# Patient Record
Sex: Female | Born: 2005 | Race: Black or African American | Hispanic: No | Marital: Single | Smoking: Never smoker
Health system: Southern US, Community
[De-identification: ages and names within clinical notes are randomized; demographics above are authoritative.]

## PROBLEM LIST (undated history)

## (undated) DIAGNOSIS — R011 Cardiac murmur, unspecified: Secondary | ICD-10-CM

## (undated) DIAGNOSIS — J353 Hypertrophy of tonsils with hypertrophy of adenoids: Secondary | ICD-10-CM

## (undated) DIAGNOSIS — J45909 Unspecified asthma, uncomplicated: Secondary | ICD-10-CM

---
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
## 2023-03-05 DIAGNOSIS — Z113 Encounter for screening for infections with a predominantly sexual mode of transmission: Secondary | ICD-10-CM | POA: Diagnosis not present

## 2023-03-15 ENCOUNTER — Encounter (HOSPITAL_COMMUNITY): Payer: Self-pay

## 2023-03-15 ENCOUNTER — Ambulatory Visit (HOSPITAL_COMMUNITY)
Admission: EM | Admit: 2023-03-15 | Discharge: 2023-03-15 | Disposition: A | Discharge status: Home / Self Care | Payer: Medicaid Other | Attending: Emergency Medicine | Admitting: Emergency Medicine

## 2023-03-15 DIAGNOSIS — J4521 Mild intermittent asthma with (acute) exacerbation: Secondary | ICD-10-CM

## 2023-03-15 MED ORDER — BENZONATATE 100 MG PO CAPS: 100.0000 mg | ORAL_CAPSULE | Freq: Three times a day (TID) | ORAL | 0 refills | Status: DC | PRN

## 2023-03-15 MED ORDER — ALBUTEROL SULFATE HFA 108 (90 BASE) MCG/ACT IN AERS
INHALATION_SPRAY | RESPIRATORY_TRACT | Status: AC
  Filled 2023-03-15: qty 6.7

## 2023-03-15 MED ORDER — ALBUTEROL SULFATE HFA 108 (90 BASE) MCG/ACT IN AERS
2.0000 | INHALATION_SPRAY | Freq: Once | RESPIRATORY_TRACT | Status: AC
  Administered 2023-03-15: 2 via RESPIRATORY_TRACT

## 2023-03-15 NOTE — ED Triage Notes (Addendum)
Pt c/o cough, SOB, and chest tightness since last night. States hx of bronchitis, out her of inhaler. NAD. States will need 2 copies of a work note.

## 2023-03-15 NOTE — Discharge Instructions (Addendum)
Please use the inhaler 3 times daily (every 6 hours) for the next several days. Then continue as needed.  The tessalon cough pills can be taken 3x daily. If this medication makes you drowsy, take only one pill before bed.  There is a women's clinic below you can call to set up an appointment with  You can scan the QR code on the last page to get established with a primary care provider

## 2023-03-15 NOTE — ED Provider Notes (Signed)
[facility]    CSN: [id number] Arrival date & time: 03/15/23  1415     History   Chief Complaint Chief Complaint  Patient presents with   Cough    HPI Jillian Mathis is a 17 y.o. female.  Here with chest tightness, dry cough, shortness of breath that began last night. History of asthma and ran out of inhaler about a year ago. She reports getting flare ups with the seasonal changes. She denies fever or congestion Possible sick contacts at work   Requires a note for work   Does not have PCP  Past Medical History:  Diagnosis Date   Asthma    prn inhaler; exacerbated by exercise   Heart murmur    innocent murmur per cardiologist 07/15/2014   Snoring 07/15/2014   Tonsillar and adenoid hypertrophy 07/2014   snores during sleep, mother denies apnea    Patient Active Problem List   Diagnosis Date Noted   History of gonorrhea 07/31/2021   Overweight, pediatric, BMI 85.0-94.9 percentile for age 30/12/2015   Idiopathic scoliosis 06/22/2015   Mild intermittent asthma 07/15/2014   Heart murmur 06/14/2014    Past Surgical History:  Procedure Laterality Date   TONSILLECTOMY AND ADENOIDECTOMY Bilateral 08/09/2014   Procedure: BILATERAL TONSILLECTOMY AND ADENOIDECTOMY;  Surgeon: Newman Pies, MD;  Location: [facility];  Service: ENT;  Laterality: Bilateral;    OB History     Gravida  1   Para      Term      Preterm      AB  1   Living         SAB      IAB  1   Ectopic      Multiple      Live Births               Home Medications    Prior to Admission medications   Medication Sig Start Date End Date Taking? Authorizing Provider  benzonatate (TESSALON) 100 MG capsule Take 1 capsule (100 mg total) by mouth 3 (three) times daily as needed for cough. 03/15/23  Yes Jaleiah Asay, Lurena Joiner, PA-C  ibuprofen (ADVIL) 800 MG tablet Take 1 tablet (800 mg total) by mouth 3 (three) times daily with meals. 06/23/22   Mardella Layman, MD    Family  History Family History  Problem Relation Age of Onset   Healthy Mother    Heart disease Father        heart murmur   ADD / ADHD Brother    Hypertension Maternal Grandmother    Hypertension Maternal Grandfather     Social History Social History   Tobacco Use   Smoking status: Never   Smokeless tobacco: Never  Vaping Use   Vaping status: Never Used  Substance Use Topics   Alcohol use: No    Alcohol/week: 0.0 standard drinks of alcohol   Drug use: Yes    Types: Marijuana     Allergies   Shellfish allergy   Review of Systems Review of Systems  Respiratory:  Positive for cough.    Per HPI  Physical Exam Triage Vital Signs ED Triage Vitals [03/15/23 1522]  Encounter Vitals Group     BP 126/77     Systolic BP Percentile      Diastolic BP Percentile      Pulse Rate 71     Resp 18     Temp 98.8 F (37.1 C)     Temp src  SpO2 96 %     Weight      Height      Head Circumference      Peak Flow      Pain Score 0     Pain Loc      Pain Education      Exclude from Growth Chart    No data found.  Updated Vital Signs BP 126/77 (BP Location: Right Arm)   Pulse 71   Temp 98.8 F (37.1 C)   Resp 18   LMP 02/25/2023 (Exact Date)   SpO2 96%   e Physical Exam Vitals and nursing note reviewed.  Constitutional:      Appearance: She is not ill-appearing.  HENT:     Nose: No congestion or rhinorrhea.     Mouth/Throat:     Mouth: Mucous membranes are moist.     Pharynx: Oropharynx is clear. No posterior oropharyngeal erythema.  Eyes:     Conjunctiva/sclera: Conjunctivae normal.  Cardiovascular:     Rate and Rhythm: Normal rate and regular rhythm.     Pulses: Normal pulses.     Heart sounds: Normal heart sounds.  Pulmonary:     Effort: Pulmonary effort is normal. No respiratory distress.     Breath sounds: Normal breath sounds. No wheezing.     Comments: No wheezing. No acute distress. Speaks in full sentences. Intermittent dry reactive cough with deep  breath  Musculoskeletal:     Cervical back: Normal range of motion.  Lymphadenopathy:     Cervical: No cervical adenopathy.  Skin:    General: Skin is warm and dry.  Neurological:     Mental Status: She is alert and oriented to person, place, and time.     UC Treatments / Results  Labs (all labs ordered are listed, but only abnormal results are displayed) Labs Reviewed - No data to display  EKG  Radiology No results found.  Procedures Procedures   Medications Ordered in UC Medications  albuterol (VENTOLIN HFA) 108 (90 Base) MCG/ACT inhaler 2 puff (2 puffs Inhalation Given 03/15/23 1545)    Initial Impression / Assessment and Plan / UC Course  I have reviewed the triage vital signs and the nursing notes.  Pertinent labs & imaging results that were available during my care of the patient were reviewed by me and considered in my medical decision making (see chart for details).  Well appearing, clear lungs, afebrile. Sating 97% on room air Reactive airway. 2 puffs inhaler given in clinic. Advised to use q6 hours for the next several days. Tessalon TID prn. Work note provided. Return and ED precautions. Patient agreeable to plan, no questions Advised establish with PCP. Patient also inquiring about ob/gyn, provided info.  Final Clinical Impressions(s) / UC Diagnoses   Final diagnoses:  Mild intermittent reactive airway disease with acute exacerbation     Discharge Instructions      Please use the inhaler 3 times daily (every 6 hours) for the next several days. Then continue as needed.  The tessalon cough pills can be taken 3x daily. If this medication makes you drowsy, take only one pill before bed.  There is a women's clinic below you can call to set up an appointment with  You can scan the QR code on the last page to get established with a primary care provider      ED Prescriptions     Medication Sig Dispense Auth. Provider   benzonatate (TESSALON) 100 MG  capsule Take 1  capsule (100 mg total) by mouth 3 (three) times daily as needed for cough. 30 capsule Darelle Kings, Lurena Joiner, PA-C      PDMP not reviewed this encounter.   Bomani Oommen, Lurena Joiner, PA-C 03/15/23 1600

## 2023-10-10 ENCOUNTER — Ambulatory Visit: Admitting: Family

## 2023-10-10 ENCOUNTER — Encounter: Payer: Self-pay | Admitting: Family

## 2023-10-10 ENCOUNTER — Other Ambulatory Visit (HOSPITAL_COMMUNITY)
Admission: RE | Admit: 2023-10-10 | Discharge: 2023-10-10 | Disposition: A | Discharge status: Home / Self Care | Source: Ambulatory Visit | Attending: Family | Admitting: Family

## 2023-10-10 VITALS — BP 121/77 | HR 101 | Temp 98.8°F | Resp 16 | Ht 63.0 in | Wt 147.2 lb

## 2023-10-10 DIAGNOSIS — Z0000 Encounter for general adult medical examination without abnormal findings: Secondary | ICD-10-CM | POA: Diagnosis not present

## 2023-10-10 DIAGNOSIS — Z1329 Encounter for screening for other suspected endocrine disorder: Secondary | ICD-10-CM

## 2023-10-10 DIAGNOSIS — Z13220 Encounter for screening for lipoid disorders: Secondary | ICD-10-CM

## 2023-10-10 DIAGNOSIS — Z131 Encounter for screening for diabetes mellitus: Secondary | ICD-10-CM

## 2023-10-10 DIAGNOSIS — Z7689 Persons encountering health services in other specified circumstances: Secondary | ICD-10-CM

## 2023-10-10 DIAGNOSIS — Z113 Encounter for screening for infections with a predominantly sexual mode of transmission: Secondary | ICD-10-CM

## 2023-10-10 DIAGNOSIS — Z13228 Encounter for screening for other metabolic disorders: Secondary | ICD-10-CM

## 2023-10-10 DIAGNOSIS — Z9089 Acquired absence of other organs: Secondary | ICD-10-CM

## 2023-10-10 DIAGNOSIS — Z130 Encounter for screening for diseases of the blood and blood-forming organs and certain disorders involving the immune mechanism: Secondary | ICD-10-CM | POA: Diagnosis not present

## 2023-10-10 DIAGNOSIS — Z9289 Personal history of other medical treatment: Secondary | ICD-10-CM

## 2023-10-10 DIAGNOSIS — Z1159 Encounter for screening for other viral diseases: Secondary | ICD-10-CM

## 2023-10-10 NOTE — Progress Notes (Signed)
 Subjective:    Jillian Mathis - 18 y.o. female MRN [id number]  Date of birth: April 28, 2006  HPI  Jillian Mathis is to establish care and annual exam.   Current issues and/or concerns: - Reports history of tonsillectomy in grade 5. States when weather is cold she feels irritation at the tonsil area. Denies red flag symptoms.  - No further issues/concerns for discussion today.  ROS per HPI    Health Maintenance:  Health Maintenance Due  Topic Date Due   Hepatitis C Screening  Never done   COVID-19 Vaccine (1 - 2024-25 season) Never done   CHLAMYDIA SCREENING  03/19/2023     Past Medical History: Patient Active Problem List   Diagnosis Date Noted   History of gonorrhea 07/31/2021   Overweight, pediatric, BMI 85.0-94.9 percentile for age 26/12/2015   Idiopathic scoliosis 06/22/2015   Mild intermittent asthma 07/15/2014   Heart murmur 06/14/2014      Social History   reports that she has never smoked. She has never used smokeless tobacco. She reports current drug use. Drug: Marijuana. She reports that she does not drink alcohol .   Family History  family history includes ADD / ADHD in her brother; Healthy in her mother; Heart disease in her father; Hypertension in her maternal grandfather and maternal grandmother.   Medications: reviewed and updated   Objective:   Physical Exam BP 121/77   Pulse (!) 101   Temp 98.8 F (37.1 C) (Oral)   Resp 16   Ht 5\' 3"  (1.6 m)   Wt 147 lb 3.2 oz (66.8 kg)   LMP 09/29/2023 (Exact Date)   SpO2 96%   BMI 26.08 kg/m   Physical Exam HENT:     Head: Normocephalic and atraumatic.     Right Ear: Tympanic membrane, ear canal and external ear normal.     Left Ear: Tympanic membrane, ear canal and external ear normal.     Nose: Nose normal.     Mouth/Throat:     Mouth: Mucous membranes are moist.     Pharynx: Oropharynx is clear.  Eyes:     Extraocular Movements: Extraocular movements intact.     Conjunctiva/sclera:  Conjunctivae normal.     Pupils: Pupils are equal, round, and reactive to light.  Neck:     Thyroid: No thyroid mass, thyromegaly or thyroid tenderness.  Cardiovascular:     Rate and Rhythm: Tachycardia present.     Pulses: Normal pulses.     Heart sounds: Normal heart sounds.  Pulmonary:     Effort: Pulmonary effort is normal.     Breath sounds: Normal breath sounds.  Chest:     Comments: Patient declined. Abdominal:     General: Bowel sounds are normal.     Palpations: Abdomen is soft.  Genitourinary:    Comments: Patient declined. Musculoskeletal:        General: Normal range of motion.     Right shoulder: Normal.     Left shoulder: Normal.     Right upper arm: Normal.     Left upper arm: Normal.     Right elbow: Normal.     Left elbow: Normal.     Right forearm: Normal.     Left forearm: Normal.     Right wrist: Normal.     Left wrist: Normal.     Right hand: Normal.     Left hand: Normal.     Cervical back: Normal, normal range of motion and neck supple.  Thoracic back: Normal.     Lumbar back: Normal.     Right hip: Normal.     Left hip: Normal.     Right upper leg: Normal.     Left upper leg: Normal.     Right knee: Normal.     Left knee: Normal.     Right lower leg: Normal.     Left lower leg: Normal.     Right ankle: Normal.     Left ankle: Normal.     Right foot: Normal.     Left foot: Normal.  Skin:    General: Skin is warm and dry.     Capillary Refill: Capillary refill takes less than 2 seconds.  Neurological:     General: No focal deficit present.     Mental Status: She is alert and oriented to person, place, and time.  Psychiatric:        Mood and Affect: Mood normal.        Behavior: Behavior normal.       Assessment & Plan:  1. Encounter to establish care (Primary) 2. Annual physical exam - Counseled on 150 minutes of exercise per week as tolerated, healthy eating (including decreased daily intake of saturated fats, cholesterol, added  sugars, sodium), STI prevention, and routine healthcare maintenance.  3. Screening for metabolic disorder - Routine screening.  - CMP14+EGFR  4. Screening for deficiency anemia - Routine screening.  - CBC  5. Diabetes mellitus screening - Routine screening.  - Hemoglobin A1c  6. Screening cholesterol level - Routine screening.  - Lipid panel  7. Thyroid disorder screen - Routine screening.  - TSH  8. Need for hepatitis C screening test - Routine screening.  - Hepatitis C Antibody  9. Routine screening for STI (sexually transmitted infection) - Routine screening.  - Cervicovaginal ancillary only  10. History of tonsillectomy - Referral to ENT for evaluation/management. - Ambulatory referral to ENT     Patient was given clear instructions to go to Emergency Department or return to medical center if symptoms don't improve, worsen, or new problems develop.The patient verbalized understanding.  I discussed the assessment and treatment plan with the patient. The patient was provided an opportunity to ask questions and all were answered. The patient agreed with the plan and demonstrated an understanding of the instructions.   The patient was advised to call back or seek an in-person evaluation if the symptoms worsen or if the condition fails to improve as anticipated.    Lavona Pounds, NP 10/10/2023, 10:40 AM [facility]

## 2023-10-10 NOTE — Progress Notes (Signed)
 No concerns.

## 2023-10-11 ENCOUNTER — Ambulatory Visit: Payer: Self-pay | Admitting: Family

## 2023-10-11 DIAGNOSIS — B9689 Other specified bacterial agents as the cause of diseases classified elsewhere: Secondary | ICD-10-CM

## 2023-10-11 DIAGNOSIS — A749 Chlamydial infection, unspecified: Secondary | ICD-10-CM

## 2023-10-11 LAB — CMP14+EGFR
ALT: 10 IU/L (ref 0–32)
AST: 16 IU/L (ref 0–40)
Albumin: 4.6 g/dL (ref 4.0–5.0)
Alkaline Phosphatase: 54 IU/L (ref 42–106)
BUN/Creatinine Ratio: 15 (ref 9–23)
BUN: 14 mg/dL (ref 6–20)
Bilirubin Total: 0.7 mg/dL (ref 0.0–1.2)
CO2: 21 mmol/L (ref 20–29)
Calcium: 9.4 mg/dL (ref 8.7–10.2)
Chloride: 104 mmol/L (ref 96–106)
Creatinine, Ser: 0.93 mg/dL (ref 0.57–1.00)
Globulin, Total: 2.6 g/dL (ref 1.5–4.5)
Glucose: 82 mg/dL (ref 70–99)
Potassium: 4.2 mmol/L (ref 3.5–5.2)
Sodium: 140 mmol/L (ref 134–144)
Total Protein: 7.2 g/dL (ref 6.0–8.5)
eGFR: 90 mL/min/{1.73_m2} (ref >59)

## 2023-10-11 LAB — CERVICOVAGINAL ANCILLARY ONLY
Bacterial Vaginitis (gardnerella): POSITIVE — AB
Candida Glabrata: NEGATIVE
Candida Vaginitis: NEGATIVE
Chlamydia: POSITIVE — AB
Comment: NEGATIVE
Comment: NEGATIVE
Comment: NEGATIVE
Comment: NEGATIVE
Comment: NEGATIVE
Comment: NORMAL
Neisseria Gonorrhea: NEGATIVE
Trichomonas: NEGATIVE

## 2023-10-11 LAB — HEPATITIS C ANTIBODY: Hep C Virus Ab: NONREACTIVE

## 2023-10-11 LAB — LIPID PANEL
Chol/HDL Ratio: 2.3 ratio (ref 0.0–4.4)
Cholesterol, Total: 143 mg/dL (ref 100–199)
HDL: 62 mg/dL (ref >39)
LDL Chol Calc (NIH): 72 mg/dL (ref 0–99)
Triglycerides: 37 mg/dL (ref 0–149)
VLDL Cholesterol Cal: 9 mg/dL (ref 5–40)

## 2023-10-11 LAB — CBC
Hematocrit: 41 % (ref 34.0–46.6)
Hemoglobin: 13.3 g/dL (ref 11.1–15.9)
MCH: 32.5 pg (ref 26.6–33.0)
MCHC: 32.4 g/dL (ref 31.5–35.7)
MCV: 100 fL — ABNORMAL HIGH (ref 79–97)
Platelets: 282 10*3/uL (ref 150–450)
RBC: 4.09 x10E6/uL (ref 3.77–5.28)
RDW: 11.1 % — ABNORMAL LOW (ref 11.7–15.4)
WBC: 7.6 10*3/uL (ref 3.4–10.8)

## 2023-10-11 LAB — HEMOGLOBIN A1C
Est. average glucose Bld gHb Est-mCnc: 100 mg/dL
Hgb A1c MFr Bld: 5.1 % (ref 4.8–5.6)

## 2023-10-11 LAB — TSH: TSH: 0.76 u[IU]/mL (ref 0.450–4.500)

## 2023-10-11 MED ORDER — DOXYCYCLINE HYCLATE 100 MG PO TABS: 100.0000 mg | ORAL_TABLET | Freq: Two times a day (BID) | ORAL | 0 refills | Status: AC

## 2023-10-11 MED ORDER — METRONIDAZOLE 500 MG PO TABS
500.0000 mg | ORAL_TABLET | Freq: Two times a day (BID) | ORAL | 0 refills | Status: AC
Start: 2023-10-11 — End: 2023-10-18

## 2023-10-16 NOTE — Telephone Encounter (Signed)
 Noted

## 2023-10-21 ENCOUNTER — Encounter: Payer: Self-pay | Admitting: Family

## 2023-10-21 ENCOUNTER — Encounter: Admitting: Family

## 2023-10-21 NOTE — Progress Notes (Signed)
 Erroneous encounter-disregard

## 2023-10-23 ENCOUNTER — Ambulatory Visit (HOSPITAL_COMMUNITY)
Admission: EM | Admit: 2023-10-23 | Discharge: 2023-10-23 | Disposition: A | Discharge status: Home / Self Care | Attending: Emergency Medicine | Admitting: Emergency Medicine

## 2023-10-23 ENCOUNTER — Encounter (HOSPITAL_COMMUNITY): Payer: Self-pay

## 2023-10-23 DIAGNOSIS — R21 Rash and other nonspecific skin eruption: Secondary | ICD-10-CM | POA: Diagnosis not present

## 2023-10-23 DIAGNOSIS — T7840XA Allergy, unspecified, initial encounter: Secondary | ICD-10-CM

## 2023-10-23 DIAGNOSIS — L509 Urticaria, unspecified: Secondary | ICD-10-CM | POA: Diagnosis not present

## 2023-10-23 MED ORDER — DEXAMETHASONE SODIUM PHOSPHATE 10 MG/ML IJ SOLN
10.0000 mg | Freq: Once | INTRAMUSCULAR | Status: AC
  Administered 2023-10-23: 10 mg via INTRAMUSCULAR

## 2023-10-23 MED ORDER — DEXAMETHASONE SODIUM PHOSPHATE 10 MG/ML IJ SOLN
INTRAMUSCULAR | Status: AC
  Filled 2023-10-23: qty 1

## 2023-10-23 MED ORDER — CETIRIZINE HCL 10 MG PO TABS: 10.0000 mg | ORAL_TABLET | Freq: Every day | ORAL | 0 refills | Status: DC

## 2023-10-23 MED ORDER — PREDNISONE 20 MG PO TABS: 40.0000 mg | ORAL_TABLET | Freq: Every day | ORAL | 0 refills | Status: AC

## 2023-10-23 NOTE — ED Provider Notes (Signed)
 [facility]    CSN: [id number] Arrival date & time: 10/23/23  0857      History   Chief Complaint Chief Complaint  Patient presents with   Rash    HPI Jillian Mathis is a 18 y.o. female.   Patient presents with rash to her arms and torso that began 2 days ago.  Patient denies any known exposures to allergens, but does state that she just completed a course of doxycycline  and Flagyl  as prescribed by her primary care provider.  Patient denies any new detergents, body washes, lotions, perfumes, or products that she can think of.  Patient also denies any new foods.  Denies lip, tongue, and throat swelling, trouble breathing, and shortness of breath.  The history is provided by the patient and medical records.  Rash   Past Medical History:  Diagnosis Date   Asthma    prn inhaler; exacerbated by exercise   Heart murmur    innocent murmur per cardiologist 07/15/2014   Snoring 07/15/2014   Tonsillar and adenoid hypertrophy 07/2014   snores during sleep, mother denies apnea    Patient Active Problem List   Diagnosis Date Noted   History of gonorrhea 07/31/2021   Overweight, pediatric, BMI 85.0-94.9 percentile for age 80/12/2015   Idiopathic scoliosis 06/22/2015   Mild intermittent asthma 07/15/2014   Heart murmur 06/14/2014    Past Surgical History:  Procedure Laterality Date   TONSILLECTOMY AND ADENOIDECTOMY Bilateral 08/09/2014   Procedure: BILATERAL TONSILLECTOMY AND ADENOIDECTOMY;  Surgeon: Reynold Caves, MD;  Location: [facility];  Service: ENT;  Laterality: Bilateral;    OB History     Gravida  1   Para      Term      Preterm      AB  1   Living         SAB      IAB  1   Ectopic      Multiple      Live Births               Home Medications    Prior to Admission medications   Medication Sig Start Date End Date Taking? Authorizing Provider  cetirizine  (ZYRTEC  ALLERGY) 10 MG tablet Take 1 tablet (10 mg total) by mouth  daily. 10/23/23  Yes Rosevelt Constable, Nakaila Freeze A, NP  predniSONE (DELTASONE) 20 MG tablet Take 2 tablets (40 mg total) by mouth daily for 5 days. 10/23/23 10/28/23 Yes Rosevelt Constable, Keiandre Cygan A, NP  benzonatate  (TESSALON ) 100 MG capsule Take 1 capsule (100 mg total) by mouth 3 (three) times daily as needed for cough. Patient not taking: Reported on 10/10/2023 03/15/23   Rising, Ivette Marks, PA-C  ibuprofen  (ADVIL ) 800 MG tablet Take 1 tablet (800 mg total) by mouth 3 (three) times daily with meals. Patient not taking: Reported on 10/10/2023 06/23/22   Afton Albright, MD    Family History Family History  Problem Relation Age of Onset   Healthy Mother    Heart disease Father        heart murmur   ADD / ADHD Brother    Hypertension Maternal Grandmother    Hypertension Maternal Grandfather     Social History Social History   Tobacco Use   Smoking status: Never   Smokeless tobacco: Never  Vaping Use   Vaping status: Never Used  Substance Use Topics   Alcohol  use: No    Alcohol /week: 0.0 standard drinks of alcohol    Drug use: Yes  Types: Marijuana     Allergies   Shellfish allergy   Review of Systems Review of Systems  Skin:  Positive for rash.   Per HPI  Physical Exam Triage Vital Signs ED Triage Vitals  Encounter Vitals Group     BP 10/23/23 1057 113/69     Systolic BP Percentile --      Diastolic BP Percentile --      Pulse Rate 10/23/23 1057 61     Resp 10/23/23 1056 17     Temp 10/23/23 1056 98.8 F (37.1 C)     Temp Source 10/23/23 1056 Oral     SpO2 10/23/23 1056 98 %     Weight --      Height --      Head Circumference --      Peak Flow --      Pain Score 10/23/23 1055 0     Pain Loc --      Pain Education --      Exclude from Growth Chart --    No data found.  Updated Vital Signs BP 113/69   Pulse 61   Temp 98.8 F (37.1 C) (Oral)   Resp 17   LMP 09/29/2023 (Exact Date)   SpO2 98%   Visual Acuity Right Eye Distance:   Left Eye Distance:   Bilateral Distance:     Right Eye Near:   Left Eye Near:    Bilateral Near:     Physical Exam Vitals and nursing note reviewed.  Constitutional:      General: She is awake. She is not in acute distress.    Appearance: Normal appearance. She is well-developed and well-groomed. She is not ill-appearing.  Skin:    General: Skin is warm and dry.     Findings: Rash present. Rash is urticarial.     Comments: Urticarial rash noted to anterior torso and bilateral arms.  Neurological:     Mental Status: She is alert.  Psychiatric:        Behavior: Behavior is cooperative.      UC Treatments / Results  Labs (all labs ordered are listed, but only abnormal results are displayed) Labs Reviewed - No data to display  EKG   Radiology No results found.  Procedures Procedures (including critical care time)  Medications Ordered in UC Medications  dexamethasone  (DECADRON ) injection 10 mg (has no administration in time range)    Initial Impression / Assessment and Plan / UC Course  I have reviewed the triage vital signs and the nursing notes.  Pertinent labs & imaging results that were available during my care of the patient were reviewed by me and considered in my medical decision making (see chart for details).     Patient is well-appearing.  Vitals are stable.  Urticarial rash noted to anterior torso and bilateral arms.  Rash could be related to doxycycline  and Flagyl , but difficult to determine.    Given IM Decadron  in clinic for rash.  Prescribed prednisone burst for additional relief of rash.  Prescribed cetirizine  to take once daily to help with itching.  Discussed use of over-the-counter Benadryl if needed.  Discussed follow-up, return, and strict ER precautions. Final Clinical Impressions(s) / UC Diagnoses   Final diagnoses:  Rash and nonspecific skin eruption  Hives  Allergic reaction, initial encounter     Discharge Instructions      You are given an injection of steroids here today  to help with your rash. Tomorrow start taking 2  tablets of prednisone once daily for 5 days to continue to help with the rash. Take 1 tablet of cetirizine  once daily to help with itching and rash. You can take 25 mg of Benadryl every 6 hours as needed for itching.  This can make you drowsy so do not drive, work, or drink alcohol  while taking this. The rash could be result of the doxycycline  and Flagyl  that you just recently took, I would discuss this with your primary care provider. Follow-up with your primary care provider or return here as needed. If you develop worsening rash, lip, tongue, or throat swelling, shortness of breath, or trouble breathing please seek immediate medical treatment in the emergency department.  ED Prescriptions     Medication Sig Dispense Auth. Provider   predniSONE (DELTASONE) 20 MG tablet Take 2 tablets (40 mg total) by mouth daily for 5 days. 10 tablet Levora Reas A, NP   cetirizine  (ZYRTEC  ALLERGY) 10 MG tablet Take 1 tablet (10 mg total) by mouth daily. 30 tablet Levora Reas A, NP      PDMP not reviewed this encounter.   Levora Reas A, NP 10/23/23 1139

## 2023-10-23 NOTE — Discharge Instructions (Signed)
 You are given an injection of steroids here today to help with your rash. Tomorrow start taking 2 tablets of prednisone once daily for 5 days to continue to help with the rash. Take 1 tablet of cetirizine  once daily to help with itching and rash. You can take 25 mg of Benadryl every 6 hours as needed for itching.  This can make you drowsy so do not drive, work, or drink alcohol  while taking this. The rash could be result of the doxycycline  and Flagyl  that you just recently took, I would discuss this with your primary care provider. Follow-up with your primary care provider or return here as needed. If you develop worsening rash, lip, tongue, or throat swelling, shortness of breath, or trouble breathing please seek immediate medical treatment in the emergency department.

## 2023-10-23 NOTE — ED Triage Notes (Addendum)
 Pt present with a rash x Monday morning. Pt states it has spread all over her body during these past two days.   Pt c/o itching and pain. Pt states she was recently taking doxycycline  and flagyl .

## 2023-11-04 ENCOUNTER — Encounter: Payer: Self-pay | Admitting: Family

## 2023-11-04 NOTE — Telephone Encounter (Signed)
 I have attempted to contact this patient by phone with the following results: no answer, left message to return my call on answering machine.

## 2023-11-05 NOTE — Telephone Encounter (Unsigned)
 Copied from CRM [id number]. Topic: General - Other >> Nov 05, 2023  9:37 AM Myrick T wrote: Reason for CRM: patient returning practice call. Per CAL the person that called patient was busy but would call her back.

## 2023-11-06 ENCOUNTER — Telehealth: Payer: Self-pay | Admitting: *Deleted

## 2023-11-06 ENCOUNTER — Other Ambulatory Visit: Payer: Self-pay | Admitting: *Deleted

## 2023-11-06 ENCOUNTER — Other Ambulatory Visit: Payer: Self-pay | Admitting: Family

## 2023-11-06 DIAGNOSIS — J4520 Mild intermittent asthma, uncomplicated: Secondary | ICD-10-CM

## 2023-11-06 DIAGNOSIS — J45909 Unspecified asthma, uncomplicated: Secondary | ICD-10-CM

## 2023-11-06 MED ORDER — ALBUTEROL SULFATE HFA 108 (90 BASE) MCG/ACT IN AERS: 2.0000 | INHALATION_SPRAY | Freq: Four times a day (QID) | RESPIRATORY_TRACT | 2 refills | Status: DC | PRN

## 2023-11-06 NOTE — Telephone Encounter (Signed)
albuterol (PROVENTIL HFA;VENTOLIN HFA) 108 (90 BASE) MCG/ACT inhaler

## 2023-11-06 NOTE — Telephone Encounter (Signed)
 Complete

## 2024-01-09 ENCOUNTER — Emergency Department (HOSPITAL_COMMUNITY)
Admission: EM | Admit: 2024-01-09 | Discharge: 2024-01-10 | Disposition: A | Discharge status: Home / Self Care | Attending: Emergency Medicine | Admitting: Emergency Medicine

## 2024-01-09 ENCOUNTER — Other Ambulatory Visit: Payer: Self-pay

## 2024-01-09 ENCOUNTER — Encounter (HOSPITAL_COMMUNITY): Payer: Self-pay

## 2024-01-09 ENCOUNTER — Ambulatory Visit (HOSPITAL_COMMUNITY)
Admission: EM | Admit: 2024-01-09 | Discharge: 2024-01-09 | Disposition: A | Discharge status: Home / Self Care | Attending: Nurse Practitioner | Admitting: Nurse Practitioner

## 2024-01-09 ENCOUNTER — Emergency Department (HOSPITAL_COMMUNITY)

## 2024-01-09 DIAGNOSIS — B9689 Other specified bacterial agents as the cause of diseases classified elsewhere: Secondary | ICD-10-CM | POA: Diagnosis not present

## 2024-01-09 DIAGNOSIS — R739 Hyperglycemia, unspecified: Secondary | ICD-10-CM

## 2024-01-09 DIAGNOSIS — N939 Abnormal uterine and vaginal bleeding, unspecified: Secondary | ICD-10-CM | POA: Insufficient documentation

## 2024-01-09 DIAGNOSIS — N760 Acute vaginitis: Secondary | ICD-10-CM | POA: Diagnosis not present

## 2024-01-09 DIAGNOSIS — R109 Unspecified abdominal pain: Secondary | ICD-10-CM | POA: Diagnosis present

## 2024-01-09 DIAGNOSIS — Z3202 Encounter for pregnancy test, result negative: Secondary | ICD-10-CM | POA: Diagnosis not present

## 2024-01-09 DIAGNOSIS — R1030 Lower abdominal pain, unspecified: Secondary | ICD-10-CM | POA: Insufficient documentation

## 2024-01-09 DIAGNOSIS — N72 Inflammatory disease of cervix uteri: Secondary | ICD-10-CM | POA: Insufficient documentation

## 2024-01-09 DIAGNOSIS — N898 Other specified noninflammatory disorders of vagina: Secondary | ICD-10-CM | POA: Insufficient documentation

## 2024-01-09 DIAGNOSIS — R1031 Right lower quadrant pain: Secondary | ICD-10-CM | POA: Diagnosis not present

## 2024-01-09 DIAGNOSIS — K37 Unspecified appendicitis: Secondary | ICD-10-CM | POA: Diagnosis not present

## 2024-01-09 LAB — COMPREHENSIVE METABOLIC PANEL WITH GFR
ALT: 18 U/L (ref 0–44)
ALT: 11 U/L (ref 0–44)
AST: 23 U/L (ref 15–41)
AST: 17 U/L (ref 15–41)
Albumin: 4 g/dL (ref 3.5–5.0)
Albumin: 4 g/dL (ref 3.5–5.0)
Alkaline Phosphatase: 51 U/L (ref 38–126)
Alkaline Phosphatase: 101 U/L (ref 38–126)
Anion gap: 12 (ref 5–15)
Anion gap: 10 (ref 5–15)
BUN: 11 mg/dL (ref 6–20)
BUN: 12 mg/dL (ref 6–20)
CO2: 23 mmol/L (ref 22–32)
CO2: 22 mmol/L (ref 22–32)
Calcium: 9.4 mg/dL (ref 8.9–10.3)
Calcium: 8.7 mg/dL — ABNORMAL LOW (ref 8.9–10.3)
Chloride: 104 mmol/L (ref 98–111)
Chloride: 105 mmol/L (ref 98–111)
Creatinine, Ser: 0.79 mg/dL (ref 0.44–1.00)
Creatinine, Ser: 0.89 mg/dL (ref 0.44–1.00)
GFR, Estimated: >60 mL/min (ref >60)
GFR, Estimated: >60 mL/min (ref >60)
Glucose, Bld: 61 mg/dL — ABNORMAL LOW (ref 70–99)
Glucose, Bld: 108 mg/dL — ABNORMAL HIGH (ref 70–99)
Potassium: 3.9 mmol/L (ref 3.5–5.1)
Potassium: 3.7 mmol/L (ref 3.5–5.1)
Sodium: 139 mmol/L (ref 135–145)
Sodium: 137 mmol/L (ref 135–145)
Total Bilirubin: 1.1 mg/dL (ref 0.0–1.2)
Total Bilirubin: 1.1 mg/dL (ref 0.0–1.2)
Total Protein: 7.1 g/dL (ref 6.5–8.1)
Total Protein: 6.5 g/dL (ref 6.5–8.1)

## 2024-01-09 LAB — CBC WITH DIFFERENTIAL/PLATELET
Abs Immature Granulocytes: 0.02 K/uL (ref 0.00–0.07)
Abs Immature Granulocytes: 0.02 K/uL (ref 0.00–0.07)
Basophils Absolute: 0 K/uL (ref 0.0–0.1)
Basophils Absolute: 0 K/uL (ref 0.0–0.1)
Basophils Relative: 0 %
Basophils Relative: 0 %
Eosinophils Absolute: 0.1 K/uL (ref 0.0–0.5)
Eosinophils Absolute: 0.1 K/uL (ref 0.0–0.5)
Eosinophils Relative: 1 %
Eosinophils Relative: 1 %
HCT: 40.7 % (ref 36.0–46.0)
HCT: 37.6 % (ref 36.0–46.0)
Hemoglobin: 13.7 g/dL (ref 12.0–15.0)
Hemoglobin: 12.2 g/dL (ref 12.0–15.0)
Immature Granulocytes: 0 %
Immature Granulocytes: 0 %
Lymphocytes Relative: 20 %
Lymphocytes Relative: 23 %
Lymphs Abs: 1.3 K/uL (ref 0.7–4.0)
Lymphs Abs: 2 K/uL (ref 0.7–4.0)
MCH: 32.7 pg (ref 26.0–34.0)
MCH: 31.6 pg (ref 26.0–34.0)
MCHC: 33.7 g/dL (ref 30.0–36.0)
MCHC: 32.4 g/dL (ref 30.0–36.0)
MCV: 97.1 fL (ref 80.0–100.0)
MCV: 97.4 fL (ref 80.0–100.0)
Monocytes Absolute: 0.5 K/uL (ref 0.1–1.0)
Monocytes Absolute: 0.6 K/uL (ref 0.1–1.0)
Monocytes Relative: 8 %
Monocytes Relative: 7 %
Neutro Abs: 4.7 K/uL (ref 1.7–7.7)
Neutro Abs: 5.9 K/uL (ref 1.7–7.7)
Neutrophils Relative %: 71 %
Neutrophils Relative %: 69 %
Platelets: 230 K/uL (ref 150–400)
Platelets: 217 K/uL (ref 150–400)
RBC: 4.19 MIL/uL (ref 3.87–5.11)
RBC: 3.86 MIL/uL — ABNORMAL LOW (ref 3.87–5.11)
RDW: 11.9 % (ref 11.5–15.5)
RDW: 11.9 % (ref 11.5–15.5)
WBC: 6.6 K/uL (ref 4.0–10.5)
WBC: 8.6 K/uL (ref 4.0–10.5)
nRBC: 0 % (ref 0.0–0.2)
nRBC: 0 % (ref 0.0–0.2)

## 2024-01-09 LAB — POCT URINALYSIS DIP (MANUAL ENTRY)
Bilirubin, UA: NEGATIVE
Glucose, UA: NEGATIVE mg/dL
Ketones, POC UA: NEGATIVE mg/dL
Nitrite, UA: NEGATIVE
Protein Ur, POC: NEGATIVE mg/dL
Spec Grav, UA: 1.015 (ref 1.010–1.025)
Urobilinogen, UA: 1 U/dL
pH, UA: 7 (ref 5.0–8.0)

## 2024-01-09 LAB — HCG, SERUM, QUALITATIVE: Preg, Serum: NEGATIVE

## 2024-01-09 LAB — POCT URINE PREGNANCY: Preg Test, Ur: NEGATIVE

## 2024-01-09 LAB — LIPASE, BLOOD
Lipase: 28 U/L (ref 11–51)
Lipase: 22 U/L (ref 11–51)

## 2024-01-09 MED ORDER — IBUPROFEN 800 MG PO TABS: 800.0000 mg | ORAL_TABLET | Freq: Three times a day (TID) | ORAL | 0 refills | Status: AC | PRN

## 2024-01-09 MED ORDER — DOXYCYCLINE HYCLATE 100 MG PO CAPS: 100.0000 mg | ORAL_CAPSULE | Freq: Two times a day (BID) | ORAL | 0 refills | Status: DC

## 2024-01-09 MED ORDER — ONDANSETRON HCL 4 MG/2ML IJ SOLN
4.0000 mg | Freq: Once | INTRAMUSCULAR | Status: AC
  Administered 2024-01-09: 4 mg via INTRAVENOUS
  Filled 2024-01-09: qty 2

## 2024-01-09 MED ORDER — MORPHINE SULFATE (PF) 4 MG/ML IV SOLN
4.0000 mg | Freq: Once | INTRAVENOUS | Status: AC
  Administered 2024-01-09: 4 mg via INTRAVENOUS
  Filled 2024-01-09: qty 1

## 2024-01-09 MED ORDER — SODIUM CHLORIDE 0.9 % IV SOLN
1.0000 g | Freq: Once | INTRAVENOUS | Status: AC
  Administered 2024-01-10: 1 g via INTRAVENOUS
  Filled 2024-01-09: qty 10

## 2024-01-09 MED ORDER — ONDANSETRON HCL 4 MG/2ML IJ SOLN
4.0000 mg | Freq: Once | INTRAMUSCULAR | Status: AC
  Administered 2024-01-10: 4 mg via INTRAVENOUS
  Filled 2024-01-09: qty 2

## 2024-01-09 MED ORDER — SODIUM CHLORIDE 0.9 % IV BOLUS
1000.0000 mL | Freq: Once | INTRAVENOUS | Status: AC
  Administered 2024-01-09: 1000 mL via INTRAVENOUS

## 2024-01-09 MED ORDER — AZITHROMYCIN 250 MG PO TABS
1000.0000 mg | ORAL_TABLET | Freq: Once | ORAL | Status: AC
  Administered 2024-01-10: 1000 mg via ORAL
  Filled 2024-01-09: qty 4

## 2024-01-09 MED ORDER — ONDANSETRON 4 MG PO TBDP
ORAL_TABLET | ORAL | 0 refills | Status: AC
Start: 2024-01-09 — End: ?

## 2024-01-09 MED ORDER — METRONIDAZOLE 500 MG PO TABS
2000.0000 mg | ORAL_TABLET | Freq: Once | ORAL | Status: DC
  Filled 2024-01-09: qty 4

## 2024-01-09 NOTE — Discharge Instructions (Addendum)
 Follow up with your PCP or GYN in the office.  Return for worsening pain, fever, inability to eat or drink.   Take 4 over the counter ibuprofen  tablets 3 times a day or 2 over-the-counter naproxen  tablets twice a day for pain. Also take tylenol  1000mg (2 extra strength) four times a day.

## 2024-01-09 NOTE — ED Triage Notes (Signed)
 Chief Complaint: abdominal pain. States she started her cycle was staring. States on seeing blood when she wipes but not on her pad. Saw some brown discharge this morning. Denies NVD.   Sick exposure: No  Onset: 2 days ago.   Prescriptions or OTC medications tried: No    New foods, medications, or products: No  Recent Travel: No

## 2024-01-09 NOTE — ED Provider Notes (Signed)
 [place] EMERGENCY DEPARTMENT AT [facility] Provider Note   CSN: [id number] Arrival date & time: 01/09/24  8187     Patient presents with: No chief complaint on file.   Jillian Mathis is a 18 y.o. female.  {Add pertinent medical, surgical, social history, OB history to HPI:32947} 18 yo F with a chief complaints of right sided abdominal discomfort.  Going on for about 3 days.  Having some nausea but no vomiting.  Also having some vaginal discharge and some vaginal bleeding.  No fevers or chills.  No prior abdominal surgery.  Went to urgent care and they encouraged her to come here for evaluation.        Prior to Admission medications   Medication Sig Start Date End Date Taking? Authorizing Provider  doxycycline  (VIBRAMYCIN ) 100 MG capsule Take 1 capsule (100 mg total) by mouth 2 (two) times daily for 14 days. One po bid x 7 days 01/09/24 01/23/24 Yes Emil Share, DO  ibuprofen  (ADVIL ) 800 MG tablet Take 1 tablet (800 mg total) by mouth every 8 (eight) hours as needed (pain). Take with food to avoid stomach upset. Do not take any additional NSAIDs while on this. You may take tylenol  in addition to this if needed for extra pain relief. 01/09/24   Iola Lukes, FNP    Allergies: Shellfish allergy    Review of Systems  Updated Vital Signs BP 126/76   Pulse 63   Temp 98.6 F (37 C) (Oral)   Resp 16   LMP 01/06/2024 (Approximate)   SpO2 99%   Physical Exam Vitals and nursing note reviewed.  Constitutional:      General: She is not in acute distress.    Appearance: She is well-developed. She is not diaphoretic.  HENT:     Head: Normocephalic and atraumatic.  Eyes:     Pupils: Pupils are equal, round, and reactive to light.  Cardiovascular:     Rate and Rhythm: Normal rate and regular rhythm.     Heart sounds: No murmur heard.    No friction rub. No gallop.  Pulmonary:     Effort: Pulmonary effort is normal.     Breath sounds: No wheezing or rales.   Abdominal:     General: There is no distension.     Palpations: Abdomen is soft.     Tenderness: There is abdominal tenderness.     Comments: Diffusely tender abdomen  Genitourinary:    Comments: Purulent discharge from the cervix.  Cervical motion tenderness.  No obvious unilateral adnexal tenderness or masses. Musculoskeletal:        General: No tenderness.     Cervical back: Normal range of motion and neck supple.  Skin:    General: Skin is warm and dry.  Neurological:     Mental Status: She is alert and oriented to person, place, and time.  Psychiatric:        Behavior: Behavior normal.     (all labs ordered are listed, but only abnormal results are displayed) Labs Reviewed  CBC WITH DIFFERENTIAL/PLATELET - Abnormal; Notable for the following components:      Result Value   RBC 3.86 (*)    All other components within normal limits  COMPREHENSIVE METABOLIC PANEL WITH GFR - Abnormal; Notable for the following components:   Glucose, Bld 108 (*)    Calcium 8.7 (*)    All other components within normal limits  LIPASE, BLOOD  HCG, SERUM, QUALITATIVE  URINALYSIS, ROUTINE W REFLEX MICROSCOPIC  RPR  HIV ANTIBODY (ROUTINE TESTING W REFLEX)  GC/CHLAMYDIA PROBE AMP ([place]) NOT AT [facility]    EKG: None  Radiology: CT Renal Stone Study Result Date: 01/09/2024 CLINICAL DATA:  Abdominal/flank pain, stone suspected. Rule out appendicitis. C/O RLQ abdominal pain X 3 days. Pt also reports possible allergic reaction to ibuprofen , reports taking it after leaving urgent care, and R eye became swollen. EXAM: CT ABDOMEN AND PELVIS WITHOUT CONTRAST TECHNIQUE: Multidetector CT imaging of the abdomen and pelvis was performed following the standard protocol without IV contrast. RADIATION DOSE REDUCTION: This exam was performed according to the departmental dose-optimization program which includes automated exposure control, adjustment of the mA and/or kV according to patient size and/or use of  iterative reconstruction technique. COMPARISON:  CT abdomen pelvis 03/18/2022 FINDINGS: Lower chest: No acute abnormality. Hepatobiliary: No focal liver abnormality. No gallstones, gallbladder wall thickening, or pericholecystic fluid. No biliary dilatation. Pancreas: No focal lesion. Normal pancreatic contour. No surrounding inflammatory changes. No main pancreatic ductal dilatation. Spleen: Normal in size without focal abnormality. Adrenals/Urinary Tract: No adrenal nodule bilaterally. No nephrolithiasis and no hydronephrosis. No definite contour-deforming renal mass. No ureterolithiasis or hydroureter. The urinary bladder is unremarkable. Stomach/Bowel: Stomach is within normal limits. No evidence of bowel wall thickening or dilatation. Appendix appears normal. Vascular/Lymphatic: No abdominal aorta or iliac aneurysm. No abdominal, pelvic, or inguinal lymphadenopathy. Reproductive: Uterus and bilateral adnexa are unremarkable. Other: No intraperitoneal free fluid. No intraperitoneal free gas. No organized fluid collection. Musculoskeletal: No abdominal wall hernia or abnormality. No suspicious lytic or blastic osseous lesions. No acute displaced fracture. IMPRESSION: No acute intra-abdominal or intrapelvic abnormality with limited evaluation on this noncontrast study. Electronically Signed   By: Morgane  Naveau M.D.   On: 01/09/2024 23:17    {Document cardiac monitor, telemetry assessment procedure when appropriate:32947} Procedures   Medications Ordered in the ED  cefTRIAXone  (ROCEPHIN ) 1 g in sodium chloride  0.9 % 100 mL IVPB (has no administration in time range)  azithromycin  (ZITHROMAX ) tablet 1,000 mg (has no administration in time range)  metroNIDAZOLE  (FLAGYL ) tablet 2,000 mg (has no administration in time range)  ondansetron  (ZOFRAN ) injection 4 mg (has no administration in time range)  sodium chloride  0.9 % bolus 1,000 mL (0 mLs Intravenous Stopped 01/09/24 2331)  morphine  (PF) 4 MG/ML  injection 4 mg (4 mg Intravenous Given 01/09/24 2117)  ondansetron  (ZOFRAN ) injection 4 mg (4 mg Intravenous Given 01/09/24 2117)      {Click here for ABCD2, HEART and other calculators REFRESH Note before signing:1}                              Medical Decision Making Amount and/or Complexity of Data Reviewed Labs: ordered. Radiology: ordered.  Risk Prescription drug management.   18 yo F with a chief complaints of abdominal pain.  Going on for about 3 days now.  Tells me it is worse in the right lower quadrant.  Diffuse pain on exam.  Will obtain CT imaging.  Patient also complaining of vaginal discharge.  Will obtain UA.  Treat pain and nausea.  Reassess.   CT abdomen pelvis without obvious acute intra-abdominal pathology.  Pelvic exam concerning for PID.  Will start on antibiotics.  PCP follow-up.  11:37 PM:  I have discussed the diagnosis/risks/treatment options with the patient.  Evaluation and diagnostic testing in the emergency department does not suggest an emergent condition requiring admission or immediate intervention beyond what has been performed  at this time.  They will follow up with PCP. We also discussed returning to the ED immediately if new or worsening sx occur. We discussed the sx which are most concerning (e.g., sudden worsening pain, fever, inability to tolerate by mouth) that necessitate immediate return. Medications administered to the patient during their visit and any new prescriptions provided to the patient are listed below.  Medications given during this visit Medications  cefTRIAXone  (ROCEPHIN ) 1 g in sodium chloride  0.9 % 100 mL IVPB (has no administration in time range)  azithromycin  (ZITHROMAX ) tablet 1,000 mg (has no administration in time range)  metroNIDAZOLE  (FLAGYL ) tablet 2,000 mg (has no administration in time range)  ondansetron  (ZOFRAN ) injection 4 mg (has no administration in time range)  sodium chloride  0.9 % bolus 1,000 mL (0 mLs Intravenous  Stopped 01/09/24 2331)  morphine  (PF) 4 MG/ML injection 4 mg (4 mg Intravenous Given 01/09/24 2117)  ondansetron  (ZOFRAN ) injection 4 mg (4 mg Intravenous Given 01/09/24 2117)     The patient appears reasonably screen and/or stabilized for discharge and I doubt any other medical condition or other [facility] requiring further screening, evaluation, or treatment in the ED at this time prior to discharge.    {Document critical care time when appropriate  Document review of labs and clinical decision tools ie [id number], etc  Document your independent review of radiology images and any outside records  Document your discussion with family members, caretakers and with consultants  Document social determinants of health affecting pt's care  Document your decision making why or why not admission, treatments were needed:32947:::1}   Final diagnoses:  Cervicitis    ED Discharge Orders          Ordered    doxycycline  (VIBRAMYCIN ) 100 MG capsule  2 times daily        01/09/24 2333

## 2024-01-09 NOTE — Discharge Instructions (Signed)
 You were seen today for lower abdominal pain and thin vaginal discharge. Blood work, urine tests, and vaginal swabs were collected to check for possible causes such as infection, ovarian cyst, appendicitis, or urinary tract infection. The results will be available in MyChart, and you will receive a phone call if any abnormal results require immediate attention.  At this time, your symptoms are being monitored. You should drink plenty of fluids, eat light meals as tolerated, and rest as needed. Over-the-counter pain relievers such as acetaminophen  or ibuprofen  may be used for discomfort unless you have been told not to take them. Keep track of your pain, the amount and appearance of discharge, and any new symptoms.  It is important to follow up with your primary care provider or gynecologist once your test results are available so that appropriate treatment can be started if needed.   Go to the emergency department immediately if you develop worsening abdominal pain, especially on the right side, fever, chills, nausea or vomiting, inability to eat or drink, fainting, severe weakness, heavy vaginal bleeding, or any sudden changes in your condition. These may be signs of appendicitis or another urgent problem that requires immediate care.

## 2024-01-09 NOTE — ED Triage Notes (Signed)
 Pt presents to ED from UC for rule out appendicitis. C/O RLQ abdominal pain X 3 days. Pt also reports possible allergic reaction to ibuprofen , reports taking it after leaving urgent care, and R eye became swollen. Denies difficulty breathing, speaking in clear complete sentences. No hives or throat involvement noted during triage.

## 2024-01-09 NOTE — ED Provider Notes (Signed)
 [facility]    CSN: [id number] Arrival date & time: 01/09/24  9183      History   Chief Complaint Chief Complaint  Patient presents with   Abdominal Pain    HPI Jillian Mathis is a 18 y.o. female.   Discussed the use of AI scribe software for clinical note transcription with the patient, who gave verbal consent to proceed.   The patient presents with abdominal pain and vaginal discharge. The abdominal pain started on Monday, located across the lower part of the stomach, particularly tender underneath the belly button and on the lower right side. The pain is intermittent, coming and goes, and not constant. On a scale of 0 to 10, with 10 being the worst pain ever, the patient did not specify the severity. The patient denies associated symptoms such as pain or burning during urination, nausea, vomiting, fever, or loss of appetite.   Regarding the vaginal discharge, the patient noticed pink, thin discharge when wiping on Friday. This morning, she observed brown discharge, but did not specify if it was thick or thin. The patient denies any current vaginal discharge. The first day of her last menstrual period was August 5th. She denies any pain in her lower back.  The patient reports being sexually active with one female partner in the past three months, using condoms sometimes. She has not had sexual intercourse in the past month or two.  The following portions of the patient's history were reviewed and updated as appropriate: allergies, current medications, past family history, past medical history, past social history, past surgical history, and problem list.    Past Medical History:  Diagnosis Date   Asthma    prn inhaler; exacerbated by exercise   Heart murmur    innocent murmur per cardiologist 07/15/2014   Snoring 07/15/2014   Tonsillar and adenoid hypertrophy 07/2014   snores during sleep, mother denies apnea    Patient Active Problem List   Diagnosis Date Noted    History of gonorrhea 07/31/2021   Overweight, pediatric, BMI 85.0-94.9 percentile for age 07/20/2015   Idiopathic scoliosis 06/22/2015   Mild intermittent asthma 07/15/2014   Heart murmur 06/14/2014    Past Surgical History:  Procedure Laterality Date   TONSILLECTOMY AND ADENOIDECTOMY Bilateral 08/09/2014   Procedure: BILATERAL TONSILLECTOMY AND ADENOIDECTOMY;  Surgeon: Daniel Moccasin, MD;  Location: [facility];  Service: ENT;  Laterality: Bilateral;    OB History     Gravida  1   Para      Term      Preterm      AB  1   Living         SAB      IAB  1   Ectopic      Multiple      Live Births               Home Medications    Prior to Admission medications   Medication Sig Start Date End Date Taking? Authorizing Provider  ibuprofen  (ADVIL ) 800 MG tablet Take 1 tablet (800 mg total) by mouth every 8 (eight) hours as needed (pain). Take with food to avoid stomach upset. Do not take any additional NSAIDs while on this. You may take tylenol  in addition to this if needed for extra pain relief. 01/09/24  Yes Iola Lukes, FNP    Family History Family History  Problem Relation Age of Onset   Healthy Mother    Heart disease Father  heart murmur   ADD / ADHD Brother    Hypertension Maternal Grandmother    Hypertension Maternal Grandfather     Social History Social History   Tobacco Use   Smoking status: Never   Smokeless tobacco: Never  Vaping Use   Vaping status: Never Used  Substance Use Topics   Alcohol  use: No    Alcohol /week: 0.0 standard drinks of alcohol    Drug use: Yes    Types: Marijuana     Allergies   Shellfish allergy   Review of Systems Review of Systems  Constitutional:  Negative for appetite change and fever.  Gastrointestinal:  Positive for abdominal pain (lower). Negative for blood in stool, nausea and vomiting.  Genitourinary:  Negative for dysuria, menstrual problem (LMP: 12/17/23) and vaginal discharge.        Saw pink when wiping vagina after urination 4 days ago; haven't seen it since but saw brown mixed in with urine when wiping the vagina this morning   Musculoskeletal:  Negative for back pain.  All other systems reviewed and are negative.    Physical Exam Triage Vital Signs ED Triage Vitals  Encounter Vitals Group     BP 01/09/24 0919 120/75     Girls Systolic BP Percentile --      Girls Diastolic BP Percentile --      Boys Systolic BP Percentile --      Boys Diastolic BP Percentile --      Pulse Rate 01/09/24 0919 75     Resp 01/09/24 0919 16     Temp 01/09/24 0919 98.5 F (36.9 C)     Temp Source 01/09/24 0919 Oral     SpO2 01/09/24 0919 100 %     Weight --      Height 01/09/24 0918 5' 3 (1.6 m)     Head Circumference --      Peak Flow --      Pain Score 01/09/24 0917 8     Pain Loc --      Pain Education --      Exclude from Growth Chart --    No data found.  Updated Vital Signs BP 120/75 (BP Location: Left Arm)   Pulse 75   Temp 98.5 F (36.9 C) (Oral)   Resp 16   Ht 5' 3 (1.6 m)   LMP 01/06/2024 (Approximate)   SpO2 100%   BMI 26.08 kg/m   Visual Acuity Right Eye Distance:   Left Eye Distance:   Bilateral Distance:    Right Eye Near:   Left Eye Near:    Bilateral Near:     Physical Exam Constitutional:      General: She is not in acute distress.    Appearance: Normal appearance. She is not ill-appearing, toxic-appearing or diaphoretic.  HENT:     Head: Normocephalic.     Nose: Nose normal.     Mouth/Throat:     Mouth: Mucous membranes are moist.  Eyes:     Conjunctiva/sclera: Conjunctivae normal.  Cardiovascular:     Rate and Rhythm: Normal rate.  Pulmonary:     Effort: Pulmonary effort is normal.  Abdominal:     General: Bowel sounds are normal.     Palpations: Abdomen is soft.     Tenderness: There is abdominal tenderness. There is guarding. There is no rebound.      Comments: Tenderness noted below the umbilicus and around  the right lower quadrant.  Genitourinary:    Comments: Deferred; patient performed  self-swab for Aptima testing  Musculoskeletal:        General: Normal range of motion.     Cervical back: Normal range of motion and neck supple.  Skin:    General: Skin is warm and dry.  Neurological:     General: No focal deficit present.     Mental Status: She is alert and oriented to person, place, and time.  Psychiatric:        Mood and Affect: Mood normal.        Behavior: Behavior normal.      UC Treatments / Results  Labs (all labs ordered are listed, but only abnormal results are displayed) Labs Reviewed  POCT URINALYSIS DIP (MANUAL ENTRY) - Abnormal; Notable for the following components:      Result Value   Blood, UA trace-intact (*)    Leukocytes, UA Trace (*)    All other components within normal limits  URINE CULTURE  CBC WITH DIFFERENTIAL/PLATELET  COMPREHENSIVE METABOLIC PANEL WITH GFR  LIPASE, BLOOD  POCT URINE PREGNANCY  CERVICOVAGINAL ANCILLARY ONLY    EKG   Radiology No results found.  Procedures Procedures (including critical care time)  Medications Ordered in UC Medications - No data to display  Initial Impression / Assessment and Plan / UC Course  I have reviewed the triage vital signs and the nursing notes.  Pertinent labs & imaging results that were available during my care of the patient were reviewed by me and considered in my medical decision making (see chart for details).     A 18 year old female presents with intermittent lower abdominal pain since Monday, described as sharp and most pronounced in the right lower quadrant and under the umbilicus, rated 6/10 at its worst and 3/10 currently. She also reports new brown vaginal discharge noted this morning. She denies dysuria, fever, nausea, vomiting, or appetite changes. Exam shows tenderness across the lower abdomen, greater on the right side and below the umbilicus. Urine dip revealed trace leukocytes  and blood. Differential diagnosis includes pelvic inflammatory disease, ovarian cyst, appendicitis, and urinary tract infection. Vaginal discharge may be related to infection or hormonal changes, though STI is possible given history of inconsistent condom use. Blood work including CBC, CMP, and lipase was ordered, urine was sent for culture, and vaginal swabs were obtained for bacterial, yeast, and STD testing. Patient was advised to increase fluid intake, monitor symptoms closely, and follow up through MyChart for results, with a phone call if abnormalities are identified. She was instructed to seek emergency care if she develops worsening abdominal pain, fever, nausea, vomiting, inability to tolerate oral intake, or other concerning changes, as CT imaging may be needed to rule out appendicitis.  Today's evaluation has revealed no signs of a dangerous process. Discussed diagnosis with patient and/or guardian. Patient and/or guardian aware of their diagnosis, possible red flag symptoms to watch out for and need for close follow up. Patient and/or guardian understands verbal and written discharge instructions. Patient and/or guardian comfortable with plan and disposition.  Patient and/or guardian has a clear mental status at this time, good insight into illness (after discussion and teaching) and has clear judgment to make decisions regarding their care  Documentation was completed with the aid of voice recognition software. Transcription may contain typographical errors. Final Clinical Impressions(s) / UC Diagnoses   Final diagnoses:  Lower abdominal pain  Vaginal discharge     Discharge Instructions      You were seen today for lower abdominal pain and  thin vaginal discharge. Blood work, urine tests, and vaginal swabs were collected to check for possible causes such as infection, ovarian cyst, appendicitis, or urinary tract infection. The results will be available in MyChart, and you will receive a  phone call if any abnormal results require immediate attention.  At this time, your symptoms are being monitored. You should drink plenty of fluids, eat light meals as tolerated, and rest as needed. Over-the-counter pain relievers such as acetaminophen  or ibuprofen  may be used for discomfort unless you have been told not to take them. Keep track of your pain, the amount and appearance of discharge, and any new symptoms.  It is important to follow up with your primary care provider or gynecologist once your test results are available so that appropriate treatment can be started if needed.   Go to the emergency department immediately if you develop worsening abdominal pain, especially on the right side, fever, chills, nausea or vomiting, inability to eat or drink, fainting, severe weakness, heavy vaginal bleeding, or any sudden changes in your condition. These may be signs of appendicitis or another urgent problem that requires immediate care.     ED Prescriptions     Medication Sig Dispense Auth. Provider   ibuprofen  (ADVIL ) 800 MG tablet Take 1 tablet (800 mg total) by mouth every 8 (eight) hours as needed (pain). Take with food to avoid stomach upset. Do not take any additional NSAIDs while on this. You may take tylenol  in addition to this if needed for extra pain relief. 21 tablet Iola Lukes, FNP      PDMP not reviewed this encounter.   Iola Lukes, [place] 01/09/24 [phone]

## 2024-01-10 ENCOUNTER — Ambulatory Visit (HOSPITAL_COMMUNITY): Payer: Self-pay

## 2024-01-10 LAB — WET PREP, GENITAL
Sperm: NONE SEEN
Trich, Wet Prep: NONE SEEN
WBC, Wet Prep HPF POC: <10 (ref <10)
Yeast Wet Prep HPF POC: NONE SEEN

## 2024-01-10 LAB — HIV ANTIBODY (ROUTINE TESTING W REFLEX): HIV Screen 4th Generation wRfx: NONREACTIVE

## 2024-01-10 LAB — URINALYSIS, ROUTINE W REFLEX MICROSCOPIC
Bilirubin Urine: NEGATIVE
Glucose, UA: NEGATIVE mg/dL
Ketones, ur: NEGATIVE mg/dL
Nitrite: NEGATIVE
Protein, ur: NEGATIVE mg/dL
Specific Gravity, Urine: 1.019 (ref 1.005–1.030)
pH: 5 (ref 5.0–8.0)

## 2024-01-10 LAB — CERVICOVAGINAL ANCILLARY ONLY
Bacterial Vaginitis (gardnerella): POSITIVE — AB
Candida Glabrata: NEGATIVE
Candida Vaginitis: NEGATIVE
Chlamydia: POSITIVE — AB
Comment: NEGATIVE
Comment: NEGATIVE
Comment: NEGATIVE
Comment: NEGATIVE
Comment: NEGATIVE
Comment: NORMAL
Neisseria Gonorrhea: NEGATIVE
Trichomonas: NEGATIVE

## 2024-01-10 LAB — URINE CULTURE

## 2024-01-10 LAB — RPR: RPR Ser Ql: NONREACTIVE

## 2024-01-10 MED ORDER — DOXYCYCLINE HYCLATE 100 MG PO CAPS: 100.0000 mg | ORAL_CAPSULE | Freq: Two times a day (BID) | ORAL | 0 refills | Status: DC

## 2024-01-10 MED ORDER — METRONIDAZOLE 500 MG PO TABS: 500.0000 mg | ORAL_TABLET | Freq: Two times a day (BID) | ORAL | 0 refills | Status: AC

## 2024-01-10 MED ORDER — DOXYCYCLINE HYCLATE 100 MG PO CAPS
100.0000 mg | ORAL_CAPSULE | Freq: Two times a day (BID) | ORAL | 0 refills | Status: AC
Start: 2024-01-10 — End: 2024-01-24

## 2024-01-10 MED ORDER — METRONIDAZOLE 500 MG PO TABS
500.0000 mg | ORAL_TABLET | Freq: Once | ORAL | Status: AC
  Administered 2024-01-10: 500 mg via ORAL

## 2024-01-10 NOTE — ED Provider Notes (Signed)
 Care assumed from Dr. Emil, patient with PID pending wet prep and urinalysis.  Wet prep shows presence of clue cells, she will need metronidazole  added to her regiment and I have ordered a dose of metronidazole  for the emergency department.  Urinalysis is still pending.  I have reviewed the above-noted laboratory tests, my interpretation is bacterial vaginosis, bacteriuria without pyuria likely vaginal contamination.  I am discharging her with prescriptions for metronidazole  and doxycycline .  She is advised to use over-the-counter NSAIDs and acetaminophen  as needed for pain.  She will need to follow-up with her primary care provider following course of antibiotics to ensure adequacy of treatment.  Return precautions have been discussed.  Results for orders placed or performed during the hospital encounter of 01/09/24  CBC with Differential   Collection Time: 01/09/24  8:41 PM  Result Value Ref Range   WBC 8.6 4.0 - 10.5 K/uL   RBC 3.86 (L) 3.87 - 5.11 MIL/uL   Hemoglobin 12.2 12.0 - 15.0 g/dL   HCT 62.3 63.9 - 53.9 %   MCV 97.4 80.0 - 100.0 fL   MCH 31.6 26.0 - 34.0 pg   MCHC 32.4 30.0 - 36.0 g/dL   RDW 88.0 88.4 - 84.4 %   Platelets 217 150 - 400 K/uL   nRBC 0.0 0.0 - 0.2 %   Neutrophils Relative % 69 %   Neutro Abs 5.9 1.7 - 7.7 K/uL   Lymphocytes Relative 23 %   Lymphs Abs 2.0 0.7 - 4.0 K/uL   Monocytes Relative 7 %   Monocytes Absolute 0.6 0.1 - 1.0 K/uL   Eosinophils Relative 1 %   Eosinophils Absolute 0.1 0.0 - 0.5 K/uL   Basophils Relative 0 %   Basophils Absolute 0.0 0.0 - 0.1 K/uL   Immature Granulocytes 0 %   Abs Immature Granulocytes 0.02 0.00 - 0.07 K/uL  Comprehensive metabolic panel   Collection Time: 01/09/24  8:41 PM  Result Value Ref Range   Sodium 137 135 - 145 mmol/L   Potassium 3.7 3.5 - 5.1 mmol/L   Chloride 105 98 - 111 mmol/L   CO2 22 22 - 32 mmol/L   Glucose, Bld 108 (H) 70 - 99 mg/dL   BUN 12 6 - 20 mg/dL   Creatinine, Ser 9.10 0.44 - 1.00 mg/dL    Calcium 8.7 (L) 8.9 - 10.3 mg/dL   Total Protein 6.5 6.5 - 8.1 g/dL   Albumin 4.0 3.5 - 5.0 g/dL   AST 17 15 - 41 U/L   ALT 11 0 - 44 U/L   Alkaline Phosphatase 101 38 - 126 U/L   Total Bilirubin 1.1 0.0 - 1.2 mg/dL   GFR, Estimated >39 >39 mL/min   Anion gap 10 5 - 15  Lipase, blood   Collection Time: 01/09/24  8:41 PM  Result Value Ref Range   Lipase 22 11 - 51 U/L  hCG, serum, qualitative   Collection Time: 01/09/24  8:41 PM  Result Value Ref Range   Preg, Serum NEGATIVE NEGATIVE  Urinalysis, Routine w reflex microscopic -Urine, Clean Catch   Collection Time: 01/09/24 11:24 PM  Result Value Ref Range   Color, Urine YELLOW YELLOW   APPearance HAZY (A) CLEAR   Specific Gravity, Urine 1.019 1.005 - 1.030   pH 5.0 5.0 - 8.0   Glucose, UA NEGATIVE NEGATIVE mg/dL   Hgb urine dipstick MODERATE (A) NEGATIVE   Bilirubin Urine NEGATIVE NEGATIVE   Ketones, ur NEGATIVE NEGATIVE mg/dL   Protein, ur NEGATIVE  NEGATIVE mg/dL   Nitrite NEGATIVE NEGATIVE   Leukocytes,Ua TRACE (A) NEGATIVE   RBC / HPF 0-5 0 - 5 RBC/hpf   WBC, UA 6-10 0 - 5 WBC/hpf   Bacteria, UA MANY (A) NONE SEEN   Squamous Epithelial / HPF 0-5 0 - 5 /HPF   Mucus PRESENT   Wet prep, genital   Collection Time: 01/09/24 11:42 PM   Specimen: Cervix  Result Value Ref Range   Yeast Wet Prep HPF POC NONE SEEN NONE SEEN   Trich, Wet Prep NONE SEEN NONE SEEN   Clue Cells Wet Prep HPF POC PRESENT (A) NONE SEEN   WBC, Wet Prep HPF POC <10 <10   Sperm NONE SEEN       Raford Lenis, MD 01/10/24 [id number]

## 2024-01-14 LAB — GC/CHLAMYDIA PROBE AMP (~~LOC~~) NOT AT ARMC
Chlamydia: POSITIVE — AB
Comment: NEGATIVE
Comment: NORMAL
Neisseria Gonorrhea: NEGATIVE

## 2024-01-15 ENCOUNTER — Ambulatory Visit (HOSPITAL_COMMUNITY): Payer: Self-pay

## 2024-01-31 ENCOUNTER — Encounter: Payer: Self-pay | Admitting: *Deleted

## 2024-10-09 ENCOUNTER — Encounter: Admitting: Family
